# Patient Record
Sex: Female | Born: 1995 | Race: White | Hispanic: No | Marital: Single | Smoking: Former smoker
Health system: Southern US, Community
[De-identification: ages and names within clinical notes are randomized; demographics above are authoritative.]

## PROBLEM LIST (undated history)

## (undated) DIAGNOSIS — F4310 Post-traumatic stress disorder, unspecified: Secondary | ICD-10-CM

## (undated) DIAGNOSIS — J45909 Unspecified asthma, uncomplicated: Secondary | ICD-10-CM

## (undated) DIAGNOSIS — G8929 Other chronic pain: Secondary | ICD-10-CM

## (undated) DIAGNOSIS — F32A Depression, unspecified: Secondary | ICD-10-CM

## (undated) HISTORY — DX: Post-traumatic stress disorder, unspecified: F43.10

## (undated) HISTORY — DX: Depression, unspecified: F32.A

---

## 2020-04-13 HISTORY — PX: IUD REMOVAL: SHX5392

## 2020-12-09 ENCOUNTER — Encounter: Payer: Self-pay | Admitting: Family Medicine

## 2021-01-17 ENCOUNTER — Encounter: Payer: Self-pay | Admitting: Obstetrics and Gynecology

## 2021-01-18 ENCOUNTER — Encounter (HOSPITAL_COMMUNITY): Payer: Self-pay | Admitting: Family Medicine

## 2021-01-18 ENCOUNTER — Other Ambulatory Visit: Payer: Self-pay

## 2021-01-18 ENCOUNTER — Inpatient Hospital Stay (HOSPITAL_COMMUNITY)
Admission: AD | Admit: 2021-01-18 | Discharge: 2021-01-18 | Disposition: A | Discharge status: Home / Self Care | Payer: Medicaid Other | Attending: Family Medicine | Admitting: Family Medicine

## 2021-01-18 DIAGNOSIS — R1031 Right lower quadrant pain: Secondary | ICD-10-CM | POA: Diagnosis not present

## 2021-01-18 DIAGNOSIS — O26893 Other specified pregnancy related conditions, third trimester: Secondary | ICD-10-CM | POA: Insufficient documentation

## 2021-01-18 DIAGNOSIS — Z3A28 28 weeks gestation of pregnancy: Secondary | ICD-10-CM | POA: Diagnosis not present

## 2021-01-18 DIAGNOSIS — K5900 Constipation, unspecified: Secondary | ICD-10-CM

## 2021-01-18 DIAGNOSIS — R109 Unspecified abdominal pain: Secondary | ICD-10-CM

## 2021-01-18 DIAGNOSIS — Z87891 Personal history of nicotine dependence: Secondary | ICD-10-CM | POA: Diagnosis not present

## 2021-01-18 DIAGNOSIS — O99613 Diseases of the digestive system complicating pregnancy, third trimester: Secondary | ICD-10-CM | POA: Insufficient documentation

## 2021-01-18 HISTORY — DX: Unspecified asthma, uncomplicated: J45.909

## 2021-01-18 LAB — CBC WITH DIFFERENTIAL/PLATELET
Abs Immature Granulocytes: 0.1 10*3/uL — ABNORMAL HIGH (ref 0.00–0.07)
Basophils Absolute: 0 10*3/uL (ref 0.0–0.1)
Basophils Relative: 0 %
Eosinophils Absolute: 0.2 10*3/uL (ref 0.0–0.5)
Eosinophils Relative: 2 %
HCT: 37.6 % (ref 36.0–46.0)
Hemoglobin: 12.3 g/dL (ref 12.0–15.0)
Immature Granulocytes: 1 %
Lymphocytes Relative: 25 %
Lymphs Abs: 2.7 10*3/uL (ref 0.7–4.0)
MCH: 31.5 pg (ref 26.0–34.0)
MCHC: 32.7 g/dL (ref 30.0–36.0)
MCV: 96.4 fL (ref 80.0–100.0)
Monocytes Absolute: 0.9 10*3/uL (ref 0.1–1.0)
Monocytes Relative: 8 %
Neutro Abs: 7.1 10*3/uL (ref 1.7–7.7)
Neutrophils Relative %: 64 %
Platelets: 253 10*3/uL (ref 150–400)
RBC: 3.9 MIL/uL (ref 3.87–5.11)
RDW: 12.5 % (ref 11.5–15.5)
WBC: 11 10*3/uL — ABNORMAL HIGH (ref 4.0–10.5)
nRBC: 0 % (ref 0.0–0.2)

## 2021-01-18 LAB — COMPREHENSIVE METABOLIC PANEL
ALT: 14 U/L (ref 0–44)
AST: 17 U/L (ref 15–41)
Albumin: 3 g/dL — ABNORMAL LOW (ref 3.5–5.0)
Alkaline Phosphatase: 89 U/L (ref 38–126)
Anion gap: 9 (ref 5–15)
BUN: 5 mg/dL — ABNORMAL LOW (ref 6–20)
CO2: 21 mmol/L — ABNORMAL LOW (ref 22–32)
Calcium: 8.7 mg/dL — ABNORMAL LOW (ref 8.9–10.3)
Chloride: 104 mmol/L (ref 98–111)
Creatinine, Ser: 0.48 mg/dL (ref 0.44–1.00)
GFR, Estimated: >60 mL/min (ref >60)
Glucose, Bld: 76 mg/dL (ref 70–99)
Potassium: 3.6 mmol/L (ref 3.5–5.1)
Sodium: 134 mmol/L — ABNORMAL LOW (ref 135–145)
Total Bilirubin: 0.9 mg/dL (ref 0.3–1.2)
Total Protein: 6.2 g/dL — ABNORMAL LOW (ref 6.5–8.1)

## 2021-01-18 LAB — URINALYSIS, MICROSCOPIC (REFLEX)

## 2021-01-18 LAB — WET PREP, GENITAL
Clue Cells Wet Prep HPF POC: NONE SEEN
Sperm: NONE SEEN
Trich, Wet Prep: NONE SEEN
WBC, Wet Prep HPF POC: >=10 — AB (ref <10)
Yeast Wet Prep HPF POC: NONE SEEN

## 2021-01-18 LAB — URINALYSIS, ROUTINE W REFLEX MICROSCOPIC
Bilirubin Urine: NEGATIVE
Glucose, UA: NEGATIVE mg/dL
Hgb urine dipstick: NEGATIVE
Ketones, ur: 15 mg/dL — AB
Nitrite: NEGATIVE
Protein, ur: NEGATIVE mg/dL
Specific Gravity, Urine: 1.01 (ref 1.005–1.030)
pH: 6 (ref 5.0–8.0)

## 2021-01-18 NOTE — MAU Note (Signed)
Patient discharged in good condition. Instructions reviewed with patient, f/u appointment 1/5, return for VB, ROM, or contractions. Patient verbalizes understanding.

## 2021-01-18 NOTE — MAU Provider Note (Signed)
History     CSN: [id number]  Arrival date and time: 01/18/21 1226   Event Date/Time   First Provider Initiated Contact with Patient 01/18/21 1449      Chief Complaint  Patient presents with   Abdominal Pain   Abdominal Pain This is a new problem. The current episode started yesterday. The onset quality is sudden. The problem occurs intermittently. The most recent episode lasted 2 hours. The problem has been rapidly improving. The pain is located in the RLQ. The pain is at a severity of 2/10. The abdominal pain does not radiate. Associated symptoms include anorexia, constipation and frequency. Pertinent negatives include no dysuria, fever, headaches (frontal), hematuria, nausea or vomiting. Nothing aggravates the pain. She has tried nothing for the symptoms.  Morgan Rocha is a 25 y.o. G1P0 @[redacted]w[redacted]d  gestation who presents to the MAU with c/o lower abdominal cramping and stabbing pain that started last night about 10 pm and lasted until midnight. Today there has been mild cramping but nothing like last night. She rates her pain today as 2/10. Last night pain was 6/10.  Patient reports she has not started prenatal care because she had a problem with insurance. However, she did have an ultrasound at [facility] in [place] and another one at [facility]. She had an appointment scheduled for this week with [facility] but they called and rescheduled the appointment for January.   OB History     Gravida  1   Para      Term      Preterm      AB      Living         SAB      IAB      Ectopic      Multiple      Live Births              Past Medical History:  Diagnosis Date   Asthma     Past Surgical History:  Procedure Laterality Date   IUD REMOVAL N/A 04/13/2020    History reviewed. No pertinent family history.  Social History   Tobacco Use   Smoking status: Former    Types: Cigarettes, E-cigarettes    Quit date: 04/13/2020    Years since  quitting: 0.7   Smokeless tobacco: Never  Vaping Use   Vaping Use: Former  Substance Use Topics   Alcohol use: Not Currently   Drug use: Yes    Types: Marijuana    Comment: a couple puffs/weel    Allergies: No Known Allergies  No medications prior to admission.    Review of Systems  Constitutional:  Negative for fever.  HENT:  Negative for congestion, ear pain, facial swelling, sinus pain, sore throat and trouble swallowing.   Eyes:  Negative for photophobia, redness, itching and visual disturbance.  Respiratory:  Negative for shortness of breath.   Cardiovascular:  Negative for chest pain, palpitations and leg swelling.  Gastrointestinal:  Positive for abdominal pain, anorexia and constipation. Negative for nausea and vomiting.  Genitourinary:  Positive for frequency, pelvic pain and vaginal discharge. Negative for dysuria, hematuria and vaginal bleeding.  Musculoskeletal:  Positive for back pain.  Skin:  Negative for rash.  Neurological:  Negative for syncope and headaches (frontal). Dizziness: last night with the pain. Psychiatric/Behavioral:  Negative for confusion.        PTSD  Physical Exam   Blood pressure 106/61, pulse 79, temperature 98 F (36.7 C), temperature source  Oral, resp. rate 16, height 5\' 4"  (1.626 m), weight 63 kg, SpO2 97 %.  Physical Exam Vitals and nursing note reviewed.  Constitutional:      Appearance: She is well-developed.  HENT:     Head: Normocephalic.  Cardiovascular:     Rate and Rhythm: Normal rate and regular rhythm.     Heart sounds: No murmur heard. Pulmonary:     Effort: Pulmonary effort is normal.     Breath sounds: Normal breath sounds.  Abdominal:     General: Bowel sounds are normal.     Palpations: Abdomen is soft.     Tenderness: There is no abdominal tenderness (tenderness is mild with palpation). There is no right CVA tenderness or left CVA tenderness.     Comments: Gravid, c/w dates  Genitourinary:    Vagina: Vaginal  discharge present.     Cervix: Discharge and friability present.     Uterus: Not enlarged (c/w dates).      Adnexa:        Right: No tenderness.         Left: No tenderness.    Skin:    General: Skin is warm and dry.  Neurological:     General: No focal deficit present.     Mental Status: She is alert.  Psychiatric:        Mood and Affect: Mood normal.    MAU Course  Procedures  Results for orders placed or performed during the hospital encounter of 01/18/21 (from the past 24 hour(s))  Urinalysis, Routine w reflex microscopic Urine, Clean Catch     Status: Abnormal   Collection Time: 01/18/21  3:20 PM  Result Value Ref Range   Color, Urine YELLOW YELLOW   APPearance CLEAR CLEAR   Specific Gravity, Urine 1.010 1.005 - 1.030   pH 6.0 5.0 - 8.0   Glucose, UA NEGATIVE NEGATIVE mg/dL   Hgb urine dipstick NEGATIVE NEGATIVE   Bilirubin Urine NEGATIVE NEGATIVE   Ketones, ur 15 (A) NEGATIVE mg/dL   Protein, ur NEGATIVE NEGATIVE mg/dL   Nitrite NEGATIVE NEGATIVE   Leukocytes,Ua TRACE (A) NEGATIVE  Urinalysis, Microscopic (reflex)     Status: Abnormal   Collection Time: 01/18/21  3:20 PM  Result Value Ref Range   RBC / HPF 0-5 0 - 5 RBC/hpf   WBC, UA 0-5 0 - 5 WBC/hpf   Bacteria, UA RARE (A) NONE SEEN   Squamous Epithelial / LPF 0-5 0 - 5   Mucus PRESENT   CBC with Differential/Platelet     Status: Abnormal   Collection Time: 01/18/21  3:27 PM  Result Value Ref Range   WBC 11.0 (H) 4.0 - 10.5 K/uL   RBC 3.90 3.87 - 5.11 MIL/uL   Hemoglobin 12.3 12.0 - 15.0 g/dL   HCT 37.6 36.0 - 46.0 %   MCV 96.4 80.0 - 100.0 fL   MCH 31.5 26.0 - 34.0 pg   MCHC 32.7 30.0 - 36.0 g/dL   RDW 12.5 11.5 - 15.5 %   Platelets 253 150 - 400 K/uL   nRBC 0.0 0.0 - 0.2 %   Neutrophils Relative % 64 %   Neutro Abs 7.1 1.7 - 7.7 K/uL   Lymphocytes Relative 25 %   Lymphs Abs 2.7 0.7 - 4.0 K/uL   Monocytes Relative 8 %   Monocytes Absolute 0.9 0.1 - 1.0 K/uL   Eosinophils Relative 2 %   Eosinophils  Absolute 0.2 0.0 - 0.5 K/uL   Basophils Relative 0 %  Basophils Absolute 0.0 0.0 - 0.1 K/uL   Immature Granulocytes 1 %   Abs Immature Granulocytes 0.10 (H) 0.00 - 0.07 K/uL  Comprehensive metabolic panel     Status: Abnormal   Collection Time: 01/18/21  3:27 PM  Result Value Ref Range   Sodium 134 (L) 135 - 145 mmol/L   Potassium 3.6 3.5 - 5.1 mmol/L   Chloride 104 98 - 111 mmol/L   CO2 21 (L) 22 - 32 mmol/L   Glucose, Bld 76 70 - 99 mg/dL   BUN 5 (L) 6 - 20 mg/dL   Creatinine, Ser 0.48 0.44 - 1.00 mg/dL   Calcium 8.7 (L) 8.9 - 10.3 mg/dL   Total Protein 6.2 (L) 6.5 - 8.1 g/dL   Albumin 3.0 (L) 3.5 - 5.0 g/dL   AST 17 15 - 41 U/L   ALT 14 0 - 44 U/L   Alkaline Phosphatase 89 38 - 126 U/L   Total Bilirubin 0.9 0.3 - 1.2 mg/dL   GFR, Estimated >60 >60 mL/min   Anion gap 9 5 - 15  Wet prep, genital     Status: Abnormal   Collection Time: 01/18/21  5:11 PM   Specimen: PATH Cytology Cervicovaginal Ancillary Only; Genital  Result Value Ref Range   Yeast Wet Prep HPF POC NONE SEEN NONE SEEN   Trich, Wet Prep NONE SEEN NONE SEEN   Clue Cells Wet Prep HPF POC NONE SEEN NONE SEEN   WBC, Wet Prep HPF POC >=10 (A) <10   Sperm NONE SEEN     Assessment and Plan  Abdominal pain during pregnancy in third trimester  Constipation, unspecified constipation type  [redacted] weeks gestation of pregnancy EFM: Baseline 145-150, reactive tracing. Tracing reviewed with Aldona Bar, CNM.  Lab results reviewed. Discussed with the patient clinical and lab findings and plan of care. Patient exam not concerning for acute abdomen. Cervix closed. No lower extremity edema. [facility]. Discussed in detail with the patient that if she develops vaginal bleeding, leaking of fluid, persistent vomiting, feeling weak or dizzy to return to MAU immediately. Patient voices understanding and agrees with plan.    [facility] 01/18/2021, 6:25 PM
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
## 2021-01-18 NOTE — Discharge Instructions (Signed)
Be sure to start your prenatal care as scheduled. If your symptoms worsen return here for further evaluation.

## 2021-01-19 LAB — GC/CHLAMYDIA PROBE AMP (~~LOC~~) NOT AT ARMC
Chlamydia: NEGATIVE
Comment: NEGATIVE
Comment: NORMAL
Neisseria Gonorrhea: NEGATIVE

## 2021-02-13 NOTE — L&D Delivery Note (Signed)
OB/GYN Faculty Practice Delivery Note  Morgan Rocha is a 26 y.o. G1P0 s/p SVD at [redacted]w[redacted]d. She was admitted for SOL.   ROM: 0h 53m with clear/bloody fluid GBS Status: Negative/-- (01/26 0902) Maximum Maternal Temperature: 97.5F  Labor Progress: Initial SVE: 9.5/100/0. She then progressed to complete with terminal AROM.   Delivery Date/Time: 2/14 at 0226 Delivery: Called to room after patient arrived quickly from MAU. Patient was complete and involuntarily pushing. During this time, she was having variable decelerations with contractions, however while pushing at around 1+ station, HR sustained <90 bpm for several minutes. Fortunately at 2+ station with pushing and thus obtained verbal consent for vacuum placement. Risks of vacuum assistance were discussed in detail, including but not limited to, bleeding, infection, damage to maternal tissues, inability to achieve vaginal delivery of the infant and need for emergency cesarean section.   Kiwi vacuum placed and used with three pushes, no pop-offs. HR returned >120bpm during rest and removed kiwi for last push to delivery. Head delivered L OA. Nuchal cord present and delivered through. Shoulder and body delivered in usual fashion. Dr. Roselie Awkward present for delivery. Infant with spontaneous cry, but with poor tone, placed on mother's abdomen, dried and stimulated. Cord clamped x 2 shortly after delivery by provider, taken to the warmer for evaluation. Cord blood drawn. Insufficient sample for ABG. Placenta delivered spontaneously with gentle cord traction. Fundus firm with massage, methergine, and Pitocin (initially given IM without IV access, but able to have IV placed later and continued with IV pit). Labia, perineum, vagina, and cervix inspected with bilateral peri-urethral and vaginal canal lacerations. They were repaired with 4.0 Monocryl and 3.0 vicryl respectively.  Baby Weight: pending  Placenta: 3 vessel, intact. Sent to L&D Complications:  None Lacerations: bilateral peri-urethral, vaginal canal with intact perineum  EBL: 400 mL Analgesia: Local lidocaine for repair   Infant:  APGAR (1 MIN): 9   APGAR (5 MINS): [place], DO  OB Family Medicine Fellow, [facility] for [facility] 03/29/2021, 3:59 AM

## 2021-02-17 ENCOUNTER — Ambulatory Visit (INDEPENDENT_AMBULATORY_CARE_PROVIDER_SITE_OTHER): Payer: Medicaid Other | Admitting: Advanced Practice Midwife

## 2021-02-17 ENCOUNTER — Other Ambulatory Visit: Payer: Self-pay

## 2021-02-17 ENCOUNTER — Encounter: Payer: Self-pay | Admitting: Advanced Practice Midwife

## 2021-02-17 VITALS — BP 115/58 | HR 86 | Wt 147.9 lb

## 2021-02-17 DIAGNOSIS — Z3403 Encounter for supervision of normal first pregnancy, third trimester: Secondary | ICD-10-CM | POA: Diagnosis not present

## 2021-02-17 DIAGNOSIS — Z3400 Encounter for supervision of normal first pregnancy, unspecified trimester: Secondary | ICD-10-CM

## 2021-02-17 DIAGNOSIS — G8929 Other chronic pain: Secondary | ICD-10-CM

## 2021-02-17 DIAGNOSIS — M549 Dorsalgia, unspecified: Secondary | ICD-10-CM

## 2021-02-17 DIAGNOSIS — O0933 Supervision of pregnancy with insufficient antenatal care, third trimester: Secondary | ICD-10-CM

## 2021-02-17 DIAGNOSIS — Z23 Encounter for immunization: Secondary | ICD-10-CM | POA: Diagnosis not present
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
## 2021-02-17 NOTE — Progress Notes (Signed)
Patient ultrasound scheduled for 02/26/20 at 10:15 AM. Patient notified.

## 2021-02-17 NOTE — Patient Instructions (Addendum)
AREA PEDIATRIC/FAMILY PRACTICE PHYSICIANS  [place] ([address])  [facility] Chambliss, MD; Eniola, MD; Hale, MD; Hensel, MD; McDiarmid, MD; McIntyer, MD; Neal, MD; Walden, MD [address] [place], [place] [address] [phone] Mon-Fri 8:30-12:30, 1:30-5:00 Providers come to see babies at [facility] Accepting Medicaid [facility] providers who accept newborns: Koirala, MD; Morrow, MD; Wolters, MD [address], [place], [place] [address] [phone] Mon-Fri 8:00-5:30 Babies seen by providers at [facility] Does NOT accept Medicaid Please call early in hospitalization for appointment (limited availability)  [facility] Mulberry, MD [address] [place], [place] [address] [phone] Mon, Tue, Thur, Fri 8:30-5:00, Wed 10:00-7:00 (closed 1-2pm) Babies seen by [facility] providers Accepting Medicaid Rubin - Pediatrician Rubin, MD [address], [place], [place] [address] [phone] Mon-Fri 8:30-5:00, Sat 8:30-12:00 Provider comes to see babies at [facility] Accepting Medicaid Must have been referred from current patients or contacted office prior to delivery [facility] ([facility]) Brown, MD; Chandler, MD; Ettefagh, MD; Grant, MD; Lester, MD; McCormick, MD; McQueen, MD; Prose, MD; Simha, MD; Stanley, MD; Stryffeler, NP; Tebben, NP [address], [place], [place] [address] [phone] Mon, Tue, Thur, Fri 8:30-5:30, Wed 9:30-5:30, Sat 8:30-12:30 Babies seen by [facility] providers Accepting Medicaid Only accepting infants of first-time parents or siblings of current patients Hospital discharge coordinator will make follow-up appointment Jack Amos [address], [place], [place]  [address] [phone]   Fax - [phone] [facility] [address], [place], [place]  [address] Phone - [phone]   Fax - [phone] Shilpa Gosrani [address], [place], [place]  [address] [phone]  [place] ([address]) [facility] Bates, MD; [place]ass[place], MD; Cooper, Cox, MD; MD; Davis, MD; Dovico, MD; Ettefaugh, MD; Little, MD; Lowe, MD; Keiffer, MD; Melvin, MD; Sumner, MD; Williams, MD [address], [place], [place] [address] [phone] Mon-Fri 8:30-5:00 (extended evenings Mon-Thur as needed), Sat-Sun 10:00-1:00 Providers come to see babies at [facility] Accepting Medicaid for families of first-time babies and families with all children in the household age 3 and under. Must register with office prior to making appointment (M-F only). [facility] Henson, NP; Knapp, MD; Lalonde, MD; Tysinger, PA [address] [place], [place] [address] [phone] Mon-Fri 8:00-5:00 Babies seen by providers at [facility] Does NOT accept Medicaid/Commercial Insurance Only [facility] ([facility])  Artis, MD; Barnes, MD; Bratton, MD; Coccaro, MD; Lockett Gardner, MD; Kramer, MD; Marshall, MD; Netherton, MD; Poleto, MD; Skinner, MD [address] [place], [place] [address] [phone] Mon-Fri 8:30-5:30, Sat (Oct.-Mar.) 9:00-1:00 Babies seen by providers at [facility] Accepting Medicaid  [place] ([address]) [facility] Reid, MD; Warner, MD [address], [place], [place] [address] [phone] Mon-Fri 8:30-5:00, Sat 8:30-12:00 Providers come to see babies at [facility] Does NOT accept Medicaid [facility] Becker, PA; Hagler, MD; Scifres, PA; Sun, MD; Swayne, MD [address], [place],  [address] [phone] Mon-Fri 8:00-5:00 Babies seen by providers at [facility] Does NOT accept Medicaid Only accepting babies of parents who  are patients Please call early in hospitalization for appointment (limited availability)  Pediatricians Clark, MD; Frye, MD; Kelleher, MD; Mack, NP; Miller, MD; O'Keller, MD; Patterson, NP; Pudlo, MD; Puzio, MD; Thomas, MD; Tucker, MD; Twiselton, MD [address], [place], [place] [address] [phone] Mon-Fri 8:00-5:00, Sat 9:00-12:00 Providers come to see babies at [facility] Does NOT accept Medicaid  [place] ([address]) [facility] providers accepting new patients: Brake, NP; Wharton, PA [address], [place], [place] [address] [phone] Mon-Fri 8:00-5:00 Babies seen by providers at [facility] Does NOT accept Medicaid Only accepting babies of parents who are patients Please call early in hospitalization for appointment (limited availability) [facility] Gay, MD; Quinlan, MD [address] [place], [place] [address] [phone] (press 1 to schedule appointment) Mon-Fri 8:00-5:00 Providers come to see babies at [facility] Does NOT accept Medicaid [facility] Mazer, MD [address] [place], [place] [address] [phone] Mon-Fri 8:30-5:00 (lunch 12:30-1:00), extended hours by appointment only Wed 5:00-6:30 Babies seen by [facility] providers Accepting Medicaid [facility] Banks, MD; Jordan, MD; Koberlein, MD [address], [place], [place] [address] [phone] Mon-Fri 8:00-5:00 Babies seen by [facility] providers Does NOT accept Medicaid [facility] Parker, MD; Hunter, MD; Wallace, DO [address] [place], [place] [address] [phone] Mon-Fri 8:00-5:00 Babies seen by [facility] providers Does NOT accept Medicaid [facility] Brandon, PA; Brecken, PA; Christy, NP; Dees, MD; DeClaire, MD; DeWeese, MD; Hansen, NP; Mills, NP; Parrish, NP; Smoot, NP; Summer, MD; Vapne,  MD [address] [place], [place] [address] [phone] Mon-Fri 8:30-5:00, Sat 10:00-1:00 Providers come to see babies at [facility] Does NOT accept Medicaid Free prenatal information session Tuesdays at 4:45pm [facility] Bouska, MD; Gordon, PA; Jeffery, PA; Weber, PA [address] [place] [address] [phone] Mon-Fri 7:30-5:30 Babies seen by [facility] providers [facility] [address], [place], [place]  [address] [phone]   Fax - [phone]  [place] ([address] & [address]) [facility] [place] [address] [place], [place] [address] [phone] Mon-Thur 8:00-6:00 Providers come to see babies at [facility] Accepting Medicaid [facility] Anderson, NP; Badger, MD; Beal, PA; Spencer, PA [address] [place], [place] [address] [phone] Mon-Thur 7:30-7:30, Fri 7:30-4:30 Babies seen by [facility] providers Accepting Medicaid [facility] Agbuya, MD; Klett, NP; Romgoolam, MD [address], [place], [place] [address] [phone] Mon-Fri 8:30-5:00, Sat 8:30-12:00 Providers come to see babies at [facility] Accepting Medicaid Must have "Meet & Greet" appointment at office prior to delivery [facility] ([facility]) McCord, MD; Wallace, MD; Wood, MD [address], [place], [place] [address] [phone] Mon-Wed 8:00-6:00, Thur-Fri 8:00-5:00, Sat 9:00-12:00 Providers come to see babies at [facility] Does NOT accept Medicaid Only accepting siblings of current patients [facility]  [address], [place], [place]  [address] [phone]   Fax - [phone] [facility] [address], [place], [place]  [address] [phone]   Fax -  [phone]  [place] ([address] & [address])  [facility] Cirigliano, DO; Matthews, DO [address] [place],  [address] [phone] Mon-Fri 7:00-5:00 Babies seen by [facility] providers Does NOT accept Medicaid [facility] Briscoe, MD; Howley, PA; Moreira, PA [address], [place],  [address] [phone] Mon-Fri 8:00-5:00 Babies seen by [facility] providers Accepting Medicaid [facility] Boyd, MD; Church, PA; Jones, NP; Osborn, PA [address], [place],  [address] [phone] Mon-Fri 8:00-5:00 Babies seen by providers at [facility] Accepting Medicaid  [place] ([address]) [facility] [place] Wendling, DO [address] [place], [place] [address] [phone] Mon-Fri 8:00-5:00 Babies seen by [facility] providers Does NOT accept Medicaid Limited availability, please call early in hospitalization to schedule follow-up [facility] Calderon, PA; Cummings, MD; Dillard, MD; Martin, PA; Olson, MD; VanDeven, PA [address], [place], [place] [address] [phone] Mon-Fri 8:30-5:00, Sat 9:00-12:00 Babies seen by providers at [facility] Accepting Medicaid Please register online then schedule online or call office [email or web address] [facility]) Hunter, NP; Kumar, MD; Martin Rogers, PA [address], [place], [place] [address] [phone] Mon-Fri 8:00-5:00 Babies seen by providers at [facility] Accepting Medicaid [facility] ([facility]) [place], MD; Kristi Fleenor, NP; [place], MD [address], [place], [place] [address] [phone] Mon-Fri 8:00-5:30, Sat&Sun by appointment (phones open at  8:30) Babies seen by [facility] providers Accepting Medicaid Must be a first-time baby or sibling of current patient [facility] [place]  [address], [place], [place]  [address] [phone]   Fax - [phone]  [place] ([address]) [facility] Brown, PA; Cowen, PA; Rice, MD; Helton, PA; Spry, MD [address] [place], [place] [address] [phone] Mon-Thur 8:00-7:00, Fri 8:00-5:00, Sat 8:00-12:00, Sun 9:00-12:00 Babies seen by [facility] providers Accepting Medicaid [facility] [place] Coe-Goins, MD; Marshall, MD; Pierre-Louis, MD [address] [place] [address], [place], [place] [address] [phone] Mon-Thur 8:00-5:00 Babies seen by providers at [facility] Accepting Medicaid [facility] Bratton, MD; Coe-Goins, MD; Hayes, MD; Lewis, MD; List, MD; Lott, MD; Marshall, MD; Moran, MD; O'Neal, MD; Pierre-Louis, MD; Pitonzo, MD; Scholer, MD; Spangle, MD [address] [place], [place] [address] [phone] Mon-Fri 8:00-5:30, Sat (Oct.-Mar.) 9:00-1:00 Babies seen by providers at [facility] Accepting Medicaid Must fill out new patient packet, available online at [email or web address] [facility] ([facility]) Friddle, NP; Harris, NP; Kelly, NP; Logan, MD; Melvin, PA; Poth, MD; Ramadoss, MD; Stanton, NP [address], [place], [place] [address] [phone] Mon-Thur 8:00-5:30, Fri 8:00-5:00 Babies seen by providers at [facility] Accepting Medicaid  [place] ([address]) [facility] Dixon, PA; Aromas, MD; Pickard, MD; Tapia, PA [address], [place], [place] [address] [phone] Mon-Fri 8:00-5:00 Babies seen by providers at [facility] Accepting Medicaid   [place] ([address]) [facility] [place] Masneri, DO; Meyers, MD; Nelson, PA [address], [place], [place] [address] [phone] Mon-Fri 8:00-5:00 Babies seen by providers at [facility] Does NOT accept Medicaid Limited appointment availability, please call early in hospitalization  [facility] [place] Kunedd, DO; McGowen, MD [address], [place], [place] [address] [phone] Mon-Fri 8:00-5:00 Babies seen by [facility] providers Does NOT accept Medicaid [facility] [place] Cameron, MD; MacDonald, MD; Michaels, PA; Nayak, MD [address] [place] [address], [place], [place] [address] [phone] Mon-Fri 8:00-5:00 After hours clinic ([address] [place], [place] [address]) [phone] Mon-Fri 5:00-8:00, Sat 12:00-6:00, Sun 10:00-4:00 Babies seen by [facility] providers Accepting Medicaid [facility] [place] [address] [place], [place]  [address] [phone]   Fax - [phone]  [place] [phone]) Adult nurse [facility], MD [address] [place], [place], [place] [address] [phone] Mon-Fri 8:00-5:00 Babies seen by [facility] providers Does NOT accept Medicaid [facility] [place] [facility] [place]) Rene Kocher, MD [address] [place], [place] [address] [phone] Mon-Thur 8:00-7:00, Fri 8:00-5:00, Sat 8:00-12:00 Babies seen by providers at [facility] Accepting Medicaid - but does not have vaccinations in office (must be received elsewhere) Limited availability, please call early in hospitalization  [place] [phone]) [facility]  Wyvonne Lenz, MD [address], [place] [address] [phone]  Fax [phone]  [facility]  Lyndel Safe, MD, [place], [place], [place], [place] [address] [place], [place]  [address] [phone] [facility]  [address] Sherian Maroon [place], [place] [address] [phone] [address], [place], [place] [address] [phone] [facility] Office)  [facility] [address] [place], [place] [address] [phone] [facility] [address] [place], [place], [place] [address] [phone] [facility] [address], [place], [place] [address] [phone] [facility] [address] [place], [place] [address] [phone] [facility] [address], [place], [place] [address] [phone] [facility] [address], [place], [place] [address] [phone] [facility]  [address] [place], [place] [address] [phone] Dr. Belia Heman. [address], [place], [place] [address] [phone] [facility] [address], [place], [place] [address] [phone] [facility] [address] [place], [place] [address] [phone]   DOULA LIST   [employer] Doula  [place]  [phone]  [email or web address]  [email or web address]  Zula the [employer] Price [phone]  [email or web address]   [employer]   Precious Danford Bad   [email or web address]   ??THE MOTHERLY DOULA?? Zola Button   [phone]   [email or web address] .[email or web address]     The Abundant Life Doula  Olive Bass  [phone]    [email or web address]   [employer]gie[employer] Doula [employer]  Angie Rosier     [phone]     [email or web address]   Renato Gails: Doula & Photographer   Renato Gails [phone]       [email or web address]  [email or web address]   [employer] Doula Services  [place] [phone]   [email or web address] [place], [place]  Lolita Rieger  [phone]  [email or web address]    [email or web address]   [employer] Doula [employer]   Kizzie Furnish   [phone]  [email or web address]   Dina Rich [address] Doula  Dina Rich  [phone] [email or web address]  Natural Baby Doulas  Cornelious Bryant         [facility]       Lora Reynolds     [phone] [email or web address]  [email or web address]   [facility]   [place] [phone] [email or web address]   [facility] Doula Services  Camelia Eng     [phone]  [email or web address]  [facility]     [phone]  [email or web address]  Facebook and IG @[email or web address] .  [phone] [email or web address] .[phone] [email or web address]   [phone]  [phone] [email or web address]     [facility]  [phone] [email or web address]   IG [email or web address]    [phone]    [phone] [email or web address]   Lurline Hare" Free  [phone] [email or web address]    Mtende  Roll  [phone] [email or web address]   Susie Williams   [email or web address]    Denita Lung    [phone] [email or web address]     Lenard Galloway  [phone] [email or web address]    Kizzie Ide  [phone] [email or web address]    [place] Rhem    [phone]   Baby on the Brain Lucky Cowboy  [phone] [email or web address]  Doula Mama Maryjean Ka [phone] [email or web address] [email or web address]  Baby on the Brain Lucky Cowboy  [phone] [email or web address]  [facility] Enzo Montgomery [phone]  [email or web address]  [email or web address] Kell Harris-Jones  [phone] [email or web address] .[email or web address]   Ardine Eng [phone] [email or web address]   Gilda Crease  [phone] [email or web address]   Jonelle Sports  [phone] [email or web address]  [employer]  Jackelyn Poling  [phone] [email or web address]   Cristina Gong [phone] [email or web address]  MOOR Divine Myeka Dunn  [email or web address] .[email or web address]   Glory Buff [phone] [email or web address] .[email or web address] Ny [phone] [email or web address]   Eldridge Abrahams [phone] [email or web address]    Safe Medications in Pregnancy   Acne: Benzoyl Peroxide Salicylic Acid  Backache/Headache: Tylenol: 2 regular strength every 4 hours OR              2 Extra strength every 6 hours  Colds/Coughs/Allergies: Benadryl (alcohol free) 25 mg every 6 hours as needed Breath right strips Claritin Cepacol throat lozenges Chloraseptic throat spray Cold-Eeze- up to three times per day Cough drops, alcohol free Flonase (by prescription only) Guaifenesin Mucinex Robitussin DM (plain only, alcohol free) Saline nasal spray/drops Sudafed (pseudoephedrine) & Actifed ** use only after [redacted] weeks gestation and if you do not have high blood pressure Tylenol Vicks Vaporub Zinc lozenges Zyrtec   Constipation: Colace Ducolax suppositories Fleet enema Glycerin suppositories Metamucil Milk of magnesia Miralax Senokot Smooth move tea  Diarrhea: Kaopectate Imodium A-D  *NO pepto Bismol  Hemorrhoids: Anusol Anusol HC Preparation H Tucks  Indigestion: Tums Maalox Mylanta Zantac  Pepcid  Insomnia: Benadryl (alcohol free) 25mg  every 6 hours as needed Tylenol PM Unisom, no Gelcaps  Leg Cramps: Tums MagGel  Nausea/Vomiting:  Bonine Dramamine Emetrol Ginger extract Sea bands Meclizine  Nausea medication to take during pregnancy:  Unisom (doxylamine succinate 25 mg tablets) Take one tablet daily at bedtime. If symptoms are not adequately controlled, the dose can be increased to a maximum recommended dose of two tablets daily (1/2 tablet in the  morning, 1/2 tablet mid-afternoon and one at bedtime). Vitamin B6 100mg  tablets. Take one tablet twice a day (up to 200 mg per day).  Skin Rashes: Aveeno products Benadryl cream or 25mg  every 6 hours as needed Calamine Lotion 1% cortisone cream  Yeast infection: Gyne-lotrimin 7 Monistat 7   **If taking multiple medications, please check labels to avoid duplicating the same active ingredients **take medication as directed on the label ** Do not exceed 4000 mg of tylenol in 24 hours **Do not take medications that contain aspirin or ibuprofen

## 2021-02-18 ENCOUNTER — Encounter: Payer: Self-pay | Admitting: Advanced Practice Midwife

## 2021-02-18 DIAGNOSIS — O09899 Supervision of other high risk pregnancies, unspecified trimester: Secondary | ICD-10-CM | POA: Insufficient documentation

## 2021-02-18 DIAGNOSIS — Z2839 Other underimmunization status: Secondary | ICD-10-CM | POA: Insufficient documentation

## 2021-02-18 LAB — COMPREHENSIVE METABOLIC PANEL
ALT: 11 IU/L (ref 0–32)
AST: 13 IU/L (ref 0–40)
Albumin/Globulin Ratio: 1.8 (ref 1.2–2.2)
Albumin: 3.6 g/dL — ABNORMAL LOW (ref 3.9–5.0)
Alkaline Phosphatase: 137 IU/L — ABNORMAL HIGH (ref 44–121)
BUN/Creatinine Ratio: 9 (ref 9–23)
BUN: 5 mg/dL — ABNORMAL LOW (ref 6–20)
Bilirubin Total: <0.2 mg/dL (ref 0.0–1.2)
CO2: 17 mmol/L — ABNORMAL LOW (ref 20–29)
Calcium: 9.1 mg/dL (ref 8.7–10.2)
Chloride: 105 mmol/L (ref 96–106)
Creatinine, Ser: 0.54 mg/dL — ABNORMAL LOW (ref 0.57–1.00)
Globulin, Total: 2 g/dL (ref 1.5–4.5)
Glucose: 94 mg/dL (ref 70–99)
Potassium: 4.1 mmol/L (ref 3.5–5.2)
Sodium: 136 mmol/L (ref 134–144)
Total Protein: 5.6 g/dL — ABNORMAL LOW (ref 6.0–8.5)
eGFR: 133 mL/min/{1.73_m2} (ref >59)

## 2021-02-18 LAB — CBC/D/PLT+RPR+RH+ABO+RUBIGG...
Antibody Screen: NEGATIVE
Basophils Absolute: 0 10*3/uL (ref 0.0–0.2)
Basos: 0 %
EOS (ABSOLUTE): 0.2 10*3/uL (ref 0.0–0.4)
Eos: 2 %
HCV Ab: <0.1 s/co ratio (ref 0.0–0.9)
HIV Screen 4th Generation wRfx: NONREACTIVE
Hematocrit: 37 % (ref 34.0–46.6)
Hemoglobin: 12.2 g/dL (ref 11.1–15.9)
Hepatitis B Surface Ag: NEGATIVE
Immature Grans (Abs): 0 10*3/uL (ref 0.0–0.1)
Immature Granulocytes: 0 %
Lymphocytes Absolute: 2.6 10*3/uL (ref 0.7–3.1)
Lymphs: 27 %
MCH: 30.3 pg (ref 26.6–33.0)
MCHC: 33 g/dL (ref 31.5–35.7)
MCV: 92 fL (ref 79–97)
Monocytes Absolute: 0.8 10*3/uL (ref 0.1–0.9)
Monocytes: 8 %
Neutrophils Absolute: 6.1 10*3/uL (ref 1.4–7.0)
Neutrophils: 63 %
Platelets: 266 10*3/uL (ref 150–450)
RBC: 4.02 x10E6/uL (ref 3.77–5.28)
RDW: 11.6 % — ABNORMAL LOW (ref 11.7–15.4)
RPR Ser Ql: NONREACTIVE
Rh Factor: POSITIVE
Rubella Antibodies, IGG: <0.9 index — ABNORMAL LOW (ref >0.99)
WBC: 9.8 10*3/uL (ref 3.4–10.8)

## 2021-02-18 LAB — HEMOGLOBIN A1C
Est. average glucose Bld gHb Est-mCnc: 91 mg/dL
Hgb A1c MFr Bld: 4.8 % (ref 4.8–5.6)

## 2021-02-18 LAB — POCT URINALYSIS DIP (DEVICE)
Bilirubin Urine: NEGATIVE
Glucose, UA: NEGATIVE mg/dL
Hgb urine dipstick: NEGATIVE
Ketones, ur: NEGATIVE mg/dL
Nitrite: NEGATIVE
Protein, ur: NEGATIVE mg/dL
Specific Gravity, Urine: 1.02 (ref 1.005–1.030)
Urobilinogen, UA: 0.2 mg/dL (ref 0.0–1.0)
pH: 6 (ref 5.0–8.0)

## 2021-02-18 LAB — HCV INTERPRETATION

## 2021-02-19 LAB — CULTURE, OB URINE

## 2021-02-19 LAB — URINE CULTURE, OB REFLEX

## 2021-02-25 ENCOUNTER — Ambulatory Visit: Payer: Medicaid Other | Attending: Advanced Practice Midwife

## 2021-02-25 ENCOUNTER — Other Ambulatory Visit: Payer: Self-pay

## 2021-02-25 ENCOUNTER — Encounter: Payer: Self-pay | Admitting: *Deleted

## 2021-02-25 ENCOUNTER — Ambulatory Visit: Payer: Medicaid Other | Admitting: *Deleted

## 2021-02-25 VITALS — BP 122/69 | HR 106

## 2021-02-25 DIAGNOSIS — Z3A34 34 weeks gestation of pregnancy: Secondary | ICD-10-CM | POA: Insufficient documentation

## 2021-02-25 DIAGNOSIS — O0933 Supervision of pregnancy with insufficient antenatal care, third trimester: Secondary | ICD-10-CM | POA: Insufficient documentation

## 2021-02-25 DIAGNOSIS — Z363 Encounter for antenatal screening for malformations: Secondary | ICD-10-CM | POA: Diagnosis not present

## 2021-02-25 DIAGNOSIS — Z3403 Encounter for supervision of normal first pregnancy, third trimester: Secondary | ICD-10-CM

## 2021-03-03 ENCOUNTER — Ambulatory Visit (INDEPENDENT_AMBULATORY_CARE_PROVIDER_SITE_OTHER): Payer: Medicaid Other

## 2021-03-03 ENCOUNTER — Other Ambulatory Visit: Payer: Self-pay

## 2021-03-03 VITALS — BP 120/76 | HR 95 | Wt 151.3 lb

## 2021-03-03 DIAGNOSIS — O0933 Supervision of pregnancy with insufficient antenatal care, third trimester: Secondary | ICD-10-CM | POA: Diagnosis not present

## 2021-03-03 DIAGNOSIS — Z3403 Encounter for supervision of normal first pregnancy, third trimester: Secondary | ICD-10-CM | POA: Diagnosis not present

## 2021-03-03 DIAGNOSIS — Z3A35 35 weeks gestation of pregnancy: Secondary | ICD-10-CM | POA: Diagnosis not present

## 2021-03-03 DIAGNOSIS — Z8659 Personal history of other mental and behavioral disorders: Secondary | ICD-10-CM

## 2021-03-03 NOTE — Progress Notes (Signed)
° °  PRENATAL VISIT NOTE  Subjective:  Morgan Rocha is a 26 y.o. G1P0 at [redacted]w[redacted]d being seen today for ongoing prenatal care.  She is currently monitored for the following issues for this low-risk pregnancy and has Supervision of low-risk first pregnancy and Rubella non-immune status, antepartum on their problem list.  Patient reports increase in braxton hicks contractions, mostly at night.  Contractions: Irritability. Vag. Bleeding: None.  Movement: Present. Denies leaking of fluid.   The following portions of the patient's history were reviewed and updated as appropriate: allergies, current medications, past family history, past medical history, past social history, past surgical history and problem list.   Objective:   Vitals:   03/03/21 1550  BP: 120/76  Pulse: 95  Weight: 151 lb 4.8 oz (68.6 kg)    Fetal Status: Fetal Heart Rate (bpm): 141 Fundal Height: 33 cm Movement: Present     General:  Alert, oriented and cooperative. Patient is in no acute distress.  Skin: Skin is warm and dry. No rash noted.   Cardiovascular: Normal heart rate noted  Respiratory: Normal respiratory effort, no problems with respiration noted  Abdomen: Soft, gravid, appropriate for gestational age.  Pain/Pressure: Present     Pelvic: Cervical exam deferred        Extremities: Normal range of motion.  Edema: None  Mental Status: Normal mood and affect. Normal behavior. Normal judgment and thought content.   Assessment and Plan:  Pregnancy: G1P0 at [redacted]w[redacted]d 1. Encounter for supervision of normal first pregnancy in third trimester - Routine OB. Doing well. - GBS and GC/CT at next visit - Patient lives 2 hours away and would prefer virtual visits from weeks 37-39 if possible.  - Anticipatory guidance for upcoming appointments provided  2. [redacted] weeks gestation of pregnancy   3. Insufficient prenatal care in third trimester - Initial OB visit at 33 weeks  4. History of posttraumatic stress disorder  (PTSD) - Offered IBH referral, declines - Has therapist at home that she regularly meets with   Preterm labor symptoms and general obstetric precautions including but not limited to vaginal bleeding, contractions, leaking of fluid and fetal movement were reviewed in detail with the patient. Please refer to After Visit Summary for other counseling recommendations.   Return in about 1 week (around 03/10/2021).  Future Appointments  Date Time Provider [place]  03/10/2021  8:15 AM Starr [place], CNM [facility]    Renee Harder, CNM

## 2021-03-09 NOTE — Progress Notes (Signed)
° °  PRENATAL VISIT NOTE  Subjective:  Morgan Rocha is a 26 y.o. G1P0 at [redacted]w[redacted]d being seen today for ongoing prenatal care.  She is currently monitored for the following issues for this low-risk pregnancy and has Supervision of low-risk first pregnancy and Rubella non-immune status, antepartum on their problem list.  Patient reports no complaints.  Contractions: Irritability. Vag. Bleeding: None.  Movement: Present. Denies leaking of fluid.   The following portions of the patient's history were reviewed and updated as appropriate: allergies, current medications, past family history, past medical history, past social history, past surgical history and problem list.   Objective:   Vitals:   03/10/21 0821  BP: 118/76  Pulse: 89  Weight: 152 lb (68.9 kg)    Fetal Status: Fetal Heart Rate (bpm): 152 Fundal Height: 34 cm Movement: Present  Presentation: Vertex  General:  Alert, oriented and cooperative. Patient is in no acute distress.  Skin: Skin is warm and dry. No rash noted.   Cardiovascular: Normal heart rate noted  Respiratory: Normal respiratory effort, no problems with respiration noted  Abdomen: Soft, gravid, appropriate for gestational age.  Pain/Pressure: Present     Pelvic:    Dilation: Closed Effacement (%): Thick    Extremities: Normal range of motion.     Mental Status: Normal mood and affect. Normal behavior. Normal judgment and thought content.   Assessment and Plan:  Pregnancy: G1P0 at [redacted]w[redacted]d 1. Encounter for supervision of low-risk first pregnancy in third trimester -patient lives in [place], would like to space out next appts -reviewed warning signs and when to come to MAU -Vertex by vaginal exam and leopolds -will do 1 hour GTT today -chaperone present for exam   Term labor symptoms and general obstetric precautions including but not limited to vaginal bleeding, contractions, leaking of fluid and fetal movement were reviewed in detail with the patient. Please  refer to After Visit Summary for other counseling recommendations.   Return in about 2 weeks (around 03/24/2021), or LROB in two weeks.  Future Appointments  Date Time Provider Department Center  03/16/2021 10:35 AM Currie Paris, NP [facility]  03/24/2021 10:55 AM Hermina Staggers, MD [facility]     Marylene Land, CNM

## 2021-03-10 ENCOUNTER — Ambulatory Visit (INDEPENDENT_AMBULATORY_CARE_PROVIDER_SITE_OTHER): Payer: Medicaid Other | Admitting: Student

## 2021-03-10 ENCOUNTER — Other Ambulatory Visit: Payer: Self-pay

## 2021-03-10 ENCOUNTER — Other Ambulatory Visit (HOSPITAL_COMMUNITY)
Admission: RE | Admit: 2021-03-10 | Discharge: 2021-03-10 | Disposition: A | Discharge status: Home / Self Care | Payer: Medicaid Other | Source: Ambulatory Visit | Attending: Obstetrics & Gynecology | Admitting: Obstetrics & Gynecology

## 2021-03-10 VITALS — BP 118/76 | HR 89 | Wt 152.0 lb

## 2021-03-10 DIAGNOSIS — Z3403 Encounter for supervision of normal first pregnancy, third trimester: Secondary | ICD-10-CM | POA: Insufficient documentation

## 2021-03-10 DIAGNOSIS — Z3A36 36 weeks gestation of pregnancy: Secondary | ICD-10-CM

## 2021-03-11 LAB — CERVICOVAGINAL ANCILLARY ONLY
Chlamydia: NEGATIVE
Comment: NEGATIVE
Comment: NORMAL
Neisseria Gonorrhea: NEGATIVE

## 2021-03-11 LAB — GLUCOSE TOLERANCE, 1 HOUR: Glucose, 1Hr PP: 116 mg/dL (ref 70–199)

## 2021-03-14 ENCOUNTER — Encounter: Payer: Self-pay | Admitting: Obstetrics & Gynecology

## 2021-03-14 LAB — CULTURE, BETA STREP (GROUP B ONLY): Strep Gp B Culture: NEGATIVE

## 2021-03-16 ENCOUNTER — Encounter: Payer: Self-pay | Admitting: Nurse Practitioner

## 2021-03-24 ENCOUNTER — Encounter: Payer: Self-pay | Admitting: Obstetrics and Gynecology

## 2021-03-29 ENCOUNTER — Inpatient Hospital Stay (HOSPITAL_COMMUNITY)
Admission: AD | Admit: 2021-03-29 | Discharge: 2021-03-30 | DRG: 807 | Disposition: A | Discharge status: Home / Self Care | Payer: Medicaid Other | Attending: Obstetrics & Gynecology | Admitting: Obstetrics & Gynecology

## 2021-03-29 ENCOUNTER — Encounter (HOSPITAL_COMMUNITY): Payer: Self-pay | Admitting: Obstetrics & Gynecology

## 2021-03-29 ENCOUNTER — Other Ambulatory Visit: Payer: Self-pay

## 2021-03-29 DIAGNOSIS — Z20822 Contact with and (suspected) exposure to covid-19: Secondary | ICD-10-CM | POA: Diagnosis present

## 2021-03-29 DIAGNOSIS — O26893 Other specified pregnancy related conditions, third trimester: Secondary | ICD-10-CM | POA: Diagnosis present

## 2021-03-29 DIAGNOSIS — Z3A38 38 weeks gestation of pregnancy: Secondary | ICD-10-CM

## 2021-03-29 DIAGNOSIS — Z3403 Encounter for supervision of normal first pregnancy, third trimester: Secondary | ICD-10-CM

## 2021-03-29 DIAGNOSIS — O09899 Supervision of other high risk pregnancies, unspecified trimester: Secondary | ICD-10-CM

## 2021-03-29 DIAGNOSIS — Z87891 Personal history of nicotine dependence: Secondary | ICD-10-CM

## 2021-03-29 DIAGNOSIS — Z3400 Encounter for supervision of normal first pregnancy, unspecified trimester: Secondary | ICD-10-CM

## 2021-03-29 DIAGNOSIS — Z3490 Encounter for supervision of normal pregnancy, unspecified, unspecified trimester: Secondary | ICD-10-CM

## 2021-03-29 HISTORY — DX: Other chronic pain: G89.29

## 2021-03-29 LAB — CBC
HCT: 42.7 % (ref 36.0–46.0)
Hemoglobin: 13.8 g/dL (ref 12.0–15.0)
MCH: 30.3 pg (ref 26.0–34.0)
MCHC: 32.3 g/dL (ref 30.0–36.0)
MCV: 93.8 fL (ref 80.0–100.0)
Platelets: 296 10*3/uL (ref 150–400)
RBC: 4.55 MIL/uL (ref 3.87–5.11)
RDW: 13.2 % (ref 11.5–15.5)
WBC: 15.5 10*3/uL — ABNORMAL HIGH (ref 4.0–10.5)
nRBC: 0 % (ref 0.0–0.2)

## 2021-03-29 LAB — TYPE AND SCREEN
ABO/RH(D): A POS
Antibody Screen: NEGATIVE

## 2021-03-29 LAB — RPR: RPR Ser Ql: NONREACTIVE

## 2021-03-29 LAB — RESP PANEL BY RT-PCR (FLU A&B, COVID) ARPGX2
Influenza A by PCR: NEGATIVE
Influenza B by PCR: NEGATIVE
SARS Coronavirus 2 by RT PCR: NEGATIVE

## 2021-03-29 MED ORDER — DIPHENHYDRAMINE HCL 25 MG PO CAPS: 25.0000 mg | ORAL_CAPSULE | Freq: Four times a day (QID) | ORAL | Status: DC | PRN

## 2021-03-29 MED ORDER — OXYCODONE-ACETAMINOPHEN 5-325 MG PO TABS: 2.0000 | ORAL_TABLET | ORAL | Status: DC | PRN

## 2021-03-29 MED ORDER — METHYLERGONOVINE MALEATE 0.2 MG/ML IJ SOLN
INTRAMUSCULAR | Status: AC
  Filled 2021-03-29: qty 1

## 2021-03-29 MED ORDER — DIBUCAINE (PERIANAL) 1 % EX OINT: 1.0000 "application " | TOPICAL_OINTMENT | CUTANEOUS | Status: DC | PRN

## 2021-03-29 MED ORDER — WITCH HAZEL-GLYCERIN EX PADS: 1.0000 "application " | MEDICATED_PAD | CUTANEOUS | Status: DC | PRN

## 2021-03-29 MED ORDER — BENZOCAINE-MENTHOL 20-0.5 % EX AERO
1.0000 "application " | INHALATION_SPRAY | CUTANEOUS | Status: DC | PRN
  Administered 2021-03-29: 1 via TOPICAL
  Filled 2021-03-29: qty 56

## 2021-03-29 MED ORDER — LIDOCAINE HCL (PF) 1 % IJ SOLN
30.0000 mL | INTRAMUSCULAR | Status: DC | PRN
  Filled 2021-03-29: qty 30

## 2021-03-29 MED ORDER — ONDANSETRON HCL 4 MG/2ML IJ SOLN: 4.0000 mg | Freq: Four times a day (QID) | INTRAMUSCULAR | Status: DC | PRN

## 2021-03-29 MED ORDER — ONDANSETRON HCL 4 MG/2ML IJ SOLN: 4.0000 mg | INTRAMUSCULAR | Status: DC | PRN

## 2021-03-29 MED ORDER — SENNOSIDES-DOCUSATE SODIUM 8.6-50 MG PO TABS
2.0000 | ORAL_TABLET | ORAL | Status: DC
  Administered 2021-03-29 – 2021-03-30 (×2): 2 via ORAL
  Filled 2021-03-29 (×2): qty 2

## 2021-03-29 MED ORDER — OXYTOCIN 10 UNIT/ML IJ SOLN
10.0000 [IU] | Freq: Once | INTRAMUSCULAR | Status: AC
  Administered 2021-03-29: 10 [IU] via INTRAMUSCULAR
  Filled 2021-03-29: qty 1

## 2021-03-29 MED ORDER — SODIUM CHLORIDE 0.9% FLUSH: 3.0000 mL | INTRAVENOUS | Status: DC | PRN

## 2021-03-29 MED ORDER — ACETAMINOPHEN 325 MG PO TABS: 650.0000 mg | ORAL_TABLET | ORAL | Status: DC | PRN

## 2021-03-29 MED ORDER — SOD CITRATE-CITRIC ACID 500-334 MG/5ML PO SOLN: 30.0000 mL | ORAL | Status: DC | PRN

## 2021-03-29 MED ORDER — IBUPROFEN 600 MG PO TABS
600.0000 mg | ORAL_TABLET | Freq: Four times a day (QID) | ORAL | Status: DC
  Administered 2021-03-29 – 2021-03-30 (×6): 600 mg via ORAL
  Filled 2021-03-29 (×6): qty 1

## 2021-03-29 MED ORDER — LIDOCAINE HCL (PF) 1 % IJ SOLN
30.0000 mL | INTRAMUSCULAR | Status: DC | PRN
  Administered 2021-03-29: 30 mL via SUBCUTANEOUS
  Filled 2021-03-29: qty 30

## 2021-03-29 MED ORDER — SODIUM CHLORIDE 0.9% FLUSH: 3.0000 mL | Freq: Two times a day (BID) | INTRAVENOUS | Status: DC

## 2021-03-29 MED ORDER — OXYTOCIN-SODIUM CHLORIDE 30-0.9 UT/500ML-% IV SOLN
2.5000 [IU]/h | INTRAVENOUS | Status: DC
  Filled 2021-03-29: qty 500

## 2021-03-29 MED ORDER — SIMETHICONE 80 MG PO CHEW: 80.0000 mg | CHEWABLE_TABLET | ORAL | Status: DC | PRN

## 2021-03-29 MED ORDER — PRENATAL MULTIVITAMIN CH
1.0000 | ORAL_TABLET | Freq: Every day | ORAL | Status: DC
  Administered 2021-03-29 – 2021-03-30 (×2): 1 via ORAL
  Filled 2021-03-29 (×2): qty 1

## 2021-03-29 MED ORDER — LACTATED RINGERS IV SOLN: 500.0000 mL | INTRAVENOUS | Status: DC | PRN

## 2021-03-29 MED ORDER — COCONUT OIL OIL: 1.0000 "application " | TOPICAL_OIL | Status: DC | PRN

## 2021-03-29 MED ORDER — LIDOCAINE HCL (PF) 1 % IJ SOLN
INTRAMUSCULAR | Status: AC
  Administered 2021-03-29: 30 mL via SUBCUTANEOUS
  Filled 2021-03-29: qty 30

## 2021-03-29 MED ORDER — MEASLES, MUMPS & RUBELLA VAC IJ SOLR: 0.5000 mL | Freq: Once | INTRAMUSCULAR | Status: DC

## 2021-03-29 MED ORDER — OXYTOCIN BOLUS FROM INFUSION
333.0000 mL | Freq: Once | INTRAVENOUS | Status: AC
  Administered 2021-03-29: 333 mL via INTRAVENOUS

## 2021-03-29 MED ORDER — METHYLERGONOVINE MALEATE 0.2 MG/ML IJ SOLN
0.2000 mg | Freq: Once | INTRAMUSCULAR | Status: AC
  Administered 2021-03-29: 0.2 mg via INTRAMUSCULAR

## 2021-03-29 MED ORDER — OXYCODONE-ACETAMINOPHEN 5-325 MG PO TABS: 1.0000 | ORAL_TABLET | ORAL | Status: DC | PRN

## 2021-03-29 MED ORDER — SODIUM CHLORIDE 0.9 % IV SOLN: 250.0000 mL | INTRAVENOUS | Status: DC | PRN

## 2021-03-29 MED ORDER — ONDANSETRON HCL 4 MG PO TABS: 4.0000 mg | ORAL_TABLET | ORAL | Status: DC | PRN

## 2021-03-29 MED ORDER — TETANUS-DIPHTH-ACELL PERTUSSIS 5-2.5-18.5 LF-MCG/0.5 IM SUSY: 0.5000 mL | PREFILLED_SYRINGE | Freq: Once | INTRAMUSCULAR | Status: DC

## 2021-03-29 MED ORDER — ZOLPIDEM TARTRATE 5 MG PO TABS: 5.0000 mg | ORAL_TABLET | Freq: Every evening | ORAL | Status: DC | PRN

## 2021-03-29 MED ORDER — LACTATED RINGERS IV SOLN: INTRAVENOUS | Status: DC

## 2021-03-29 NOTE — H&P (Signed)
OBSTETRIC ADMISSION HISTORY AND PHYSICAL  Morgan Rocha is a 26 y.o. female G1P0 with IUP at 11w6dby LMP presenting for SOL. Arrived to the MAU at 9.5cm. She reports +FMs, No LOF, no VB, no blurry vision, headaches or peripheral edema, and RUQ pain.  She plans on breast feeding. She request nuvaring for birth control. [facility]   Dating: By LMP  --->  Estimated Date of Delivery: 04/06/21  Prenatal History/Complications:  --Rubella non-immune   Past Medical History: Past Medical History:  Diagnosis Date   Asthma    Chronic back pain    Depression    PTSD (post-traumatic stress disorder)     Past Surgical History: Past Surgical History:  Procedure Laterality Date   IUD REMOVAL N/A 04/13/2020    Obstetrical History: OB History     Gravida  1   Para      Term      Preterm      AB      Living         SAB      IAB      Ectopic      Multiple      Live Births              Social History Social History   Socioeconomic History   Marital status: Single    Spouse name: Not on file   Number of children: Not on file   Years of education: Not on file   Highest education level: Not on file  Occupational History   Not on file  Tobacco Use   Smoking status: Former    Types: Cigarettes, E-cigarettes    Quit date: 04/13/2020    Years since quitting: 0.9   Smokeless tobacco: Never  Vaping Use   Vaping Use: Former   Substances: Nicotine-salt  Substance and Sexual Activity   Alcohol use: Not Currently   Drug use: Yes    Frequency: 1.0 times per week    Types: Marijuana    Comment: a couple puffs/weel   Sexual activity: Yes    Comment: previously had IUD  Other Topics Concern   Not on file  Social History Narrative   Not on file   Social Determinants of Health   Financial Resource Strain: Not on file  Food Insecurity: No Food Insecurity   Worried About Morgan Rocha the  Last Year: Never true   RWisnerin the Last Year: Never true  Transportation Needs: No Transportation Needs   Lack of Transportation (Medical): No   Lack of Transportation (Non-Medical): No  Physical Activity: Not on file  Stress: Not on file  Social Connections: Not on file    Family History: Family History  Problem Relation Age of Onset   Hypertension Father    Post-traumatic stress disorder Father    Heart attack Father     Allergies: No Known Allergies  Medications Prior to Admission  Medication Sig Dispense Refill Last Dose   cholecalciferol (VITAMIN D3) 25 MCG (1000 UNIT) tablet Take 1,000 Units by mouth daily.      ferrous sulfate 325 (65 FE) MG tablet Take 325 mg by mouth daily with breakfast.      Prenatal Vit-Fe Fumarate-FA (MULTIVITAMIN-PRENATAL) 27-0.8 MG TABS tablet Take 1 tablet by mouth daily at 12 noon.      vitamin B-12 (CYANOCOBALAMIN) 500 MCG tablet Take 500 mcg by  mouth daily.        Review of Systems   All systems reviewed and negative except as stated in HPI  Blood pressure 125/73, pulse 76, temperature (!) 97.4 F (36.3 C), resp. rate (!) 22, height _0  (1.6 m), SpO2 99 %. General appearance: alert, cooperative, and moderate distress Lungs: Normal WOB  Heart: regular rate  Abdomen: soft, non-tender Pelvic: NEFG Extremities: Homans sign is negative, no sign of DVT Presentation: cephalic Fetal monitoringBaseline: 140 bpm, Variability: Good {> 6 bpm), Accelerations: Reactive, and Decelerations: Absent Uterine activity every 2 min  Dilation: Lip/rim Effacement (%): 100 Exam by:: Dr. Higinio Plan   Prenatal labs: ABO, Rh: --/--/A POS (02/14 0210) Antibody: NEG (02/14 0210) Rubella: <0.90 (01/05 1035) RPR: Non Reactive (01/05 1035)  HBsAg: Negative (01/05 1035)  HIV: Non Reactive (01/05 1035)  GBS: Negative/-- (01/26 0902)  1 hr Glucola passed Genetic screening  LR NIPS Anatomy US normal   Prenatal Transfer Tool  Maternal Diabetes:  No Genetic Screening: Normal Maternal Ultrasounds/Referrals: Normal Fetal Ultrasounds or other Referrals:  None Maternal Substance Abuse:  No Significant Maternal Medications:  None Significant Maternal Lab Results: Group B Strep negative  Results for orders placed or performed during the hospital encounter of 03/29/21 (from the past 24 hour(s))  Type and screen Morgan Rocha   Collection Time: 03/29/21  2:10 AM  Result Value Ref Range   ABO/RH(D) A POS    Antibody Screen NEG    Sample Expiration      04/01/2021,2359 Performed at [facility], [place] [address], [place], [place] [address]   Resp Panel by RT-PCR (Flu A&B, Covid) Nasopharyngeal Swab   Collection Time: 03/29/21  2:11 AM   Specimen: Nasopharyngeal Swab; Nasopharyngeal(NP) swabs in vial transport medium  Result Value Ref Range   SARS Coronavirus 2 by RT PCR NEGATIVE NEGATIVE   Influenza A by PCR NEGATIVE NEGATIVE   Influenza B by PCR NEGATIVE NEGATIVE  CBC   Collection Time: 03/29/21  2:11 AM  Result Value Ref Range   WBC 15.5 (H) 4.0 - 10.5 K/uL   RBC 4.55 3.87 - 5.11 MIL/uL   Hemoglobin 13.8 12.0 - 15.0 g/dL   HCT 42.7 36.0 - 46.0 %   MCV 93.8 80.0 - 100.0 fL   MCH 30.3 26.0 - 34.0 pg   MCHC 32.3 30.0 - 36.0 g/dL   RDW 13.2 11.5 - 15.5 %   Platelets 296 150 - 400 K/uL   nRBC 0.0 0.0 - 0.2 %    Patient Active Problem List   Diagnosis Date Noted   Pregnant 03/29/2021   Rubella non-immune status, antepartum 02/18/2021   Supervision of low-risk first pregnancy 02/17/2021    Assessment/Plan:  Morgan Rocha is a 26 y.o. G1P0 at 47w6dhere for SOL. Found to be 9.5cm in the MAU and delivered on L&D shortly after. See delivery note.   #Labor: Expectantly managed, delivered shortly after arrival.  #Pain: None  #FWB: Cat I>II  #ID: GBS Negative  #MOF: Breastfeeding  #MOC: Nuvaring, will discuss interaction with breastfeeding postpartum  #Circ: NA  #Rubella non-immune: MMR pp.    SPatriciaann Clan DO  03/29/2021, 4:12 AM

## 2021-03-29 NOTE — Lactation Note (Addendum)
This note was copied from a baby's chart. Lactation Consultation Note  Patient Name: Morgan Rocha [id number] Date: 03/29/2021 Reason for consult: Initial assessment;1st time breastfeeding;Primapara;Early term 37-38.6wks;Infant < 6lbs Age:24 hours  LC in to visit with P1 Mom of [redacted]w[redacted]d infant.  Baby weighed 5 lbs 14.2 oz at birth and has had 1 void and 1 stool already.   Baby fed after delivery and is now latched again.  Mom had her in cradle hold swaddled in blankets.  Baby's latch is good and baby noted to be sucking with vigor, with deep jaw extensions and signs of milk transfer noted.  After baby came off, nipple was rounded.    LC unwrapped baby to provide for STS, explaining benefit to baby.  Reviewed hand expression, colostrum easily expressed.  Reviewed spoon feeding.  Assisted Mom in using cross cradle hold and waiting for baby to open wide.  Baby opened her mouth and latched deeply to breast.    Basics reviewed. Encouraged STS and offering the breast often with cues.  Mom to try to awaken baby for feeding after 3 hrs due to baby's weight.  Talked about the need to double pump if baby becomes to sleepy to latch well and feed at least every 3 hrs.  Will continue to monitor I and O.  Mom encouraged to ask for help prn.  Maternal Data Has patient been taught Hand Expression?: Yes Does the patient have breastfeeding experience prior to this delivery?: No  Feeding Mother's Current Feeding Choice: Breast Milk  LATCH Score Latch: Grasps breast easily, tongue down, lips flanged, rhythmical sucking.  Audible Swallowing: Spontaneous and intermittent  Type of Nipple: Everted at rest and after stimulation  Comfort (Breast/Nipple): Soft / non-tender  Hold (Positioning): Assistance needed to correctly position infant at breast and maintain latch.  LATCH Score: 9   Interventions Interventions: Breast feeding basics reviewed;Assisted with latch;Skin to skin;Breast massage;Hand  express;Breast compression;Adjust position;Support pillows;Position options;LC Services brochure  Consult Status Consult Status: Follow-up Date: 03/30/21 Follow-up type: In-patient    Judee Clara 03/29/2021, 8:36 AM

## 2021-03-29 NOTE — Lactation Note (Signed)
This note was copied from a baby's chart. Lactation Consultation Note Mom holding baby STS. LC assisted baby to breast and baby latched great. Baby suckling w/occasionaly swallows noted. Mom has great everted nipples. Mom stated she has been leaking colostrum. Praised mom. Baby is on cool side so blankets covered baby and mom doing STS. Mom will be f/u on MBU.  Patient Name: Girl Kodi Guerrera [id number] Date: 03/29/2021 Reason for consult: L&D Initial assessment;Primapara;Early term 37-38.6wks Age:86 hours  Maternal Data    Feeding    LATCH Score Latch: Grasps breast easily, tongue down, lips flanged, rhythmical sucking.  Audible Swallowing: A few with stimulation  Type of Nipple: Everted at rest and after stimulation  Comfort (Breast/Nipple): Soft / non-tender  Hold (Positioning): Assistance needed to correctly position infant at breast and maintain latch.  LATCH Score: 8   Lactation Tools Discussed/Used    Interventions    Discharge    Consult Status Consult Status: Follow-up Date: 03/29/21 Follow-up type: In-patient    Azazel Franze, Diamond Nickel 03/29/2021, 4:30 AM

## 2021-03-29 NOTE — Discharge Summary (Signed)
Postpartum Discharge Summary  Patient Name: Morgan Rocha DOB: 09-Nov-1995 MRN: [id number]  Date of admission: 03/29/2021 Delivery date:03/29/2021  Delivering provider: [name]  Date of discharge: 03/30/2021  Admitting diagnosis: Pregnant [Z34.90] Intrauterine pregnancy: [redacted]w[redacted]d    Secondary diagnosis:  Principal Problem:   Pregnant Active Problems:   Supervision of low-risk first pregnancy   Rubella non-immune status, antepartum   Vacuum-assisted vaginal delivery  Additional problems: None    Discharge diagnosis: Term Pregnancy Delivered                                              Post partum procedures: None Augmentation: AROM Complications: None  Hospital course: Onset of Labor With Vaginal Delivery      26y.o. yo G1P1001 at 371w6das admitted in Active Labor on 03/29/2021. Patient had an uncomplicated labor course as follows:  Membrane Rupture Time/Date: 2:21 AM ,03/29/2021   Delivery Method:Vaginal, Vacuum (Extractor)  Episiotomy: None  Lacerations:  Periurethral  Patient had an uncomplicated postpartum course.  She is ambulating, tolerating a regular diet, passing flatus, and urinating well. Patient is discharged home in stable condition on 03/30/21.  Newborn Data: Birth date:03/29/2021  Birth time:2:26 AM  Gender:Female  Living status:Living  Apgars:9 ,9  Weight:2670 g   Magnesium Sulfate received: No BMZ received: No Rhophylac:N/A MMMGN:OIBBCWUnd ordered post partum T-DaP:Given prenatally Flu: Given prenatally Transfusion:No  Physical exam  Vitals:   03/29/21 1425 03/29/21 1755 03/29/21 2051 03/30/21 0605  BP: 131/79 110/81 117/73 122/86  Pulse: 86 85 87 89  Resp: 18 16 16 18   Temp: 98.5 F (36.9 C) 98.4 F (36.9 C) 97.6 F (36.4 C) 98.1 F (36.7 C)  TempSrc: Oral Oral Oral Oral  SpO2: 99% 99%    Height:       General: alert Lochia: appropriate Uterine Fundus: firm Incision: N/A DVT Evaluation: No evidence of DVT seen on physical  exam. Labs: Lab Results  Component Value Date   WBC 15.5 (H) 03/29/2021   HGB 13.8 03/29/2021   HCT 42.7 03/29/2021   MCV 93.8 03/29/2021   PLT 296 03/29/2021   CMP Latest Ref Rng & Units 02/17/2021  Glucose 70 - 99 mg/dL 94  BUN 6 - 20 mg/dL 5(L)  Creatinine 0.57 - 1.00 mg/dL 0.54(L)  Sodium 134 - 144 mmol/L 136  Potassium 3.5 - 5.2 mmol/L 4.1  Chloride 96 - 106 mmol/L 105  CO2 20 - 29 mmol/L 17(L)  Calcium 8.7 - 10.2 mg/dL 9.1  Total Protein 6.0 - 8.5 g/dL 5.6(L)  Total Bilirubin 0.0 - 1.2 mg/dL <0.2  Alkaline Phos 44 - 121 IU/L 137(H)  AST 0 - 40 IU/L 13  ALT 0 - 32 IU/L 11   Edinburgh Score: Edinburgh Postnatal Depression Scale Screening Tool 03/30/2021  I have been able to laugh and see the funny side of things. 0  I have looked forward with enjoyment to things. 0  I have blamed myself unnecessarily when things went wrong. 0  I have been anxious or worried for no good reason. 0  I have felt scared or panicky for no good reason. 0  Things have been getting on top of me. 0  I have been so unhappy that I have had difficulty sleeping. 0  I have felt sad or miserable. 0  I have been so unhappy that I  have been crying. 0  The thought of harming myself has occurred to me. 0  Edinburgh Postnatal Depression Scale Total 0     After visit meds:  Allergies as of 03/30/2021   No Known Allergies      Medication List     STOP taking these medications    vitamin B-12 500 MCG tablet Commonly known as: CYANOCOBALAMIN       TAKE these medications    acetaminophen 325 MG tablet Commonly known as: Tylenol Take 2 tablets (650 mg total) by mouth every 4 (four) hours as needed (for pain scale < 4).   cholecalciferol 25 MCG (1000 UNIT) tablet Commonly known as: VITAMIN D3 Take 1,000 Units by mouth daily.   ferrous sulfate 325 (65 FE) MG tablet Take 325 mg by mouth daily with breakfast.   ibuprofen 600 MG tablet Commonly known as: ADVIL Take 1 tablet (600 mg total) by  mouth every 6 (six) hours.   multivitamin-prenatal 27-0.8 MG Tabs tablet Take 1 tablet by mouth daily at 12 noon.         Discharge home in stable condition Infant Feeding: Breast Infant Disposition:home with mother Discharge instruction: per After Visit Summary and Postpartum booklet. Activity: Advance as tolerated. Pelvic rest for 6 weeks.  Diet: routine diet Future Appointments:No future appointments. Follow up Visit: Message sent to [facility] by Dr. Higinio Plan on 2/15  Please schedule this patient for a In person postpartum visit in 6 weeks with the following provider: Any provider. Additional Postpartum F/U:BP check 1 week  Low risk pregnancy complicated by:  None Delivery mode:  Vaginal, Vacuum Neurosurgeon)  Anticipated Birth Control:   plans Nuvaring at 6 weeks   Renard Matter, MD, MPH OB Fellow, Faculty Practice

## 2021-03-29 NOTE — MAU Note (Signed)
Morgan Rocha is a 26 y.o. at [redacted]w[redacted]d here in MAU reporting: Pt having ctx starting 4 hours ago. Leaking fluid. Ctx 3 min apart.  LMP:  Onset of complaint: 2100 Pain score: 10/10 Vitals:   03/29/21 0139  BP: 135/82  Pulse: (!) 110  Resp: (!) 22  Temp: (!) 97.4 F (36.3 C)  SpO2: 99%     FHT: Lab orders placed from triage:

## 2021-03-29 NOTE — Lactation Note (Signed)
This note was copied from a baby's chart. Lactation Consultation Note Attempted to see mom, mom still being repaired.  Patient Name: Morgan Rocha [id number] Date: 03/29/2021   Age:24 hours  Maternal Data    Feeding    LATCH Score                    Lactation Tools Discussed/Used    Interventions    Discharge    Consult Status      Charyl Dancer 03/29/2021, 3:20 AM

## 2021-03-30 MED ORDER — ACETAMINOPHEN 325 MG PO TABS: 650.0000 mg | ORAL_TABLET | ORAL | 0 refills | Status: AC | PRN

## 2021-03-30 MED ORDER — IBUPROFEN 600 MG PO TABS: 600.0000 mg | ORAL_TABLET | Freq: Four times a day (QID) | ORAL | 0 refills | Status: AC

## 2021-03-30 NOTE — Clinical Social Work Maternal (Signed)
CLINICAL SOCIAL WORK MATERNAL/CHILD NOTE  Patient Details  Name: Girl Marquerite Forsman MRN: [id number] Date of Birth: 03/29/2019  Date:  03/30/2021  Clinical Social Worker Initiating Note:  Darra Lis, [place] Date/Time: Initiated:  03/30/21/0945     Child's Name:  Charlynn Court   Biological Parents:  Mother, Father Ruffin Pyo 28)   Need for Interpreter:  None   Reason for Referral:  Late or No Prenatal Care  , Behavioral Health Concerns   Address:  [place]  [address]    Phone number:  [phone] (home)     Additional phone number:   Household Members/Support Persons (HM/SP):   Household Member/Support Person 1   HM/SP Name Relationship DOB or Age  HM/SP -Pioneer Other 28  HM/SP -2        HM/SP -3        HM/SP -4        HM/SP -5        HM/SP -6        HM/SP -7        HM/SP -8          Natural Supports (not living in the home):  Friends   Professional Supports: None   Employment: Unemployed   Type of Work:     Education:  [place] arranged:    Museum/gallery curator Resources:  [employer]   Other Resources:  Physicist, medical   (Provided informaiton on [employer].)   Cultural/Religious Considerations Which May Impact Care:    Strengths:  Ability to meet basic needs  , Home prepared for child  , Pediatrician chosen   Psychotropic Medications:         Pediatrician:     ([place], [place])  Pediatrician List:   [facility]      Pediatrician Fax Number:    Risk Factors/Current Problems:  Substance Use     Cognitive State:  Alert  , Insightful  , Linear Thinking     Mood/Affect:  Flat  , Interested     CSW Assessment: CSW consulted for [facility] at #[id number], THC use and history of PTSD. CSW met with MOB to assess and provide support. CSW introduced self and role. CSW observed infant sleeping in bassinet. CSW  informed MOB of the reason for consult. MOB had a flat affect but was polite. MOB reported she received late prenatal care due to understaffing in the city she resides. MOB stated all of the doctors quit and it took awhile to get an appointment scheduled due to insurance challenges. MOB denies any additional barriers. CSW informed MOB of the hospital drug screen policy. MOB aware a CPS report will be made if infant test positive for substances.  MOB disclosed she smoked THC during the pregnancy to help cope with PTSD. MOB stated she last smoked about a week ago and denies additional substance use.   CSW inquired on MOB mental health history. MOB reported being diagnosed with PTSD, anxiety and depression at 26 years old. MOB stated she was prescribed medication in the past (unable to recall the name), which she did not find helpful. MOB shared she has been to therapy in the past and she is open to it if needs arise. MOB initially denied any mental health concerns during pregnancy, however when asked about previous hospitalization MOB  reported she was hospitalized at [facility] during the first trimester for almost a week. MOB stated she was incorrectly informed she had a miscarriage, which was traumatic and  caused depressive symptoms. MOB confirmed she was also hospitalized in [place] about 2 years ago for depression. MOB reported she voluntarily hospitalized herself on both occassions. CSW asked MOB how she copes with depressive symptoms. MOB stated she practices breathing exercises and writing. MOB was receptive to resources but declined any referrals. MOB identified FOB and friends as supports. MOB denies any SI, HI or DV. MOB reported she is currently doing good and bonding well with infant. MOB did not display any acute mental health symptoms.   CSW provided education regarding the baby blues period versus PPD. CSW provided the New Mom Checklist and encouraged MOB to self evaluate. MOB is comfortable notifying a  professional if needs arise.  CSW provided review of Sudden Infant Death Syndrome (SIDS) precautions. MOB has a bassinet, car seat and all infant essentials. MOB reports no additional stressors or needs at this time.  CSW will continue to follow UDS/CDS and make a CPS report if warranted. CSW identifies no further need for intervention and no barriers to discharge at this time.  CSW Plan/Description:  No Further Intervention Required/No Barriers to Discharge, CSW Will Continue to Monitor Umbilical Cord Tissue Drug Screen Results and Make Report if Warranted, Child Protective Service Report  , [place], Perinatal Mood and Anxiety Disorder (PMADs) Education, Sudden Infant Death Syndrome (SIDS) Education, Other Information/Referral to [employer], Other Patient/Family Education    Waylan Boga, [place] 03/30/2021, 10:45 AM

## 2021-03-30 NOTE — Lactation Note (Signed)
This note was copied from a baby's chart. Lactation Consultation Note  Patient Name: Morgan Rocha [id number] Date: 03/30/2021   Age:24 hours  Mom has no questions or concerns & says breastfeeding "has been great." On exam, her nipples are atraumatic and her breasts palpate as filling. Mom reports that she has been leaking since the 1st trimester and that with hand expression she is able to spray some distance. Mom says she has noticed that her breasts are heavier today and she has noticed softening of the breast with feedings.   Mom has 4 pumps at home, including Spectra and Medela. On exam, Mom appears to be a size 24 flange. Her R breast could possibly use a size 21 flange, but I told Mom to only get that if too much areola is being pulled in with the size 24. I do not think Mom needs to pump at this time, however. Engorgement care/precautions were briefly discussed.  I have no concerns; an update was given to Dr. Margo Aye.   Lurline Hare [facility] 03/30/2021, 11:02 AM

## 2021-04-06 ENCOUNTER — Ambulatory Visit: Payer: Self-pay

## 2021-04-08 ENCOUNTER — Telehealth (HOSPITAL_COMMUNITY): Payer: Self-pay | Admitting: *Deleted

## 2021-04-08 NOTE — Telephone Encounter (Signed)
Phone voicemail message left to return nurse call.  Duffy Rhody, RN 04-08-2021 at 10:38am

## 2021-05-11 ENCOUNTER — Ambulatory Visit: Payer: Self-pay | Admitting: Medical

## 2022-02-08 IMAGING — US US MFM OB DETAIL+14 WK
1 series · 13 of 28 positions shown · non-contrast
Comparison: none

[Series 1: us mfm ob detail+14 wk · 88 acquisitions, 13 frames shown]
[im 4/88]
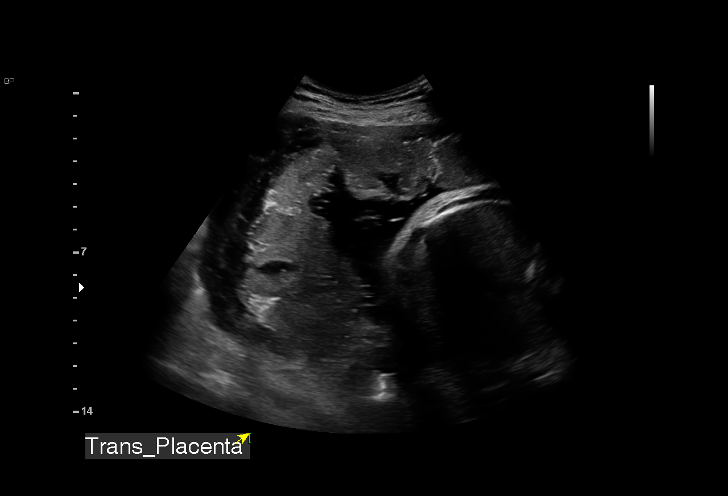
[im 10/88]
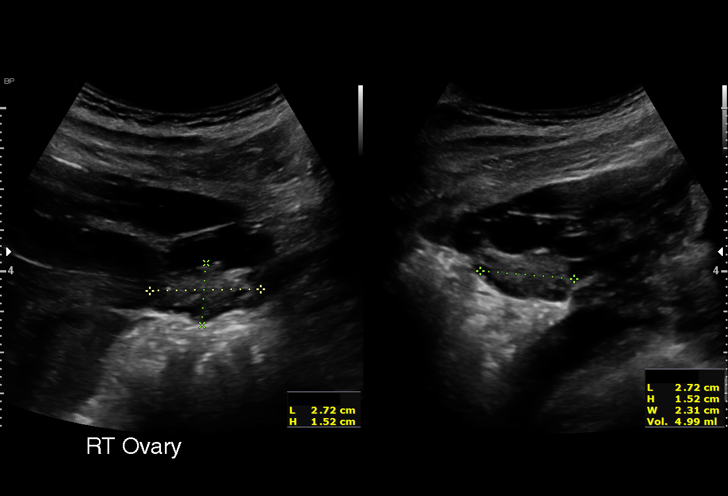
[im 17/88]
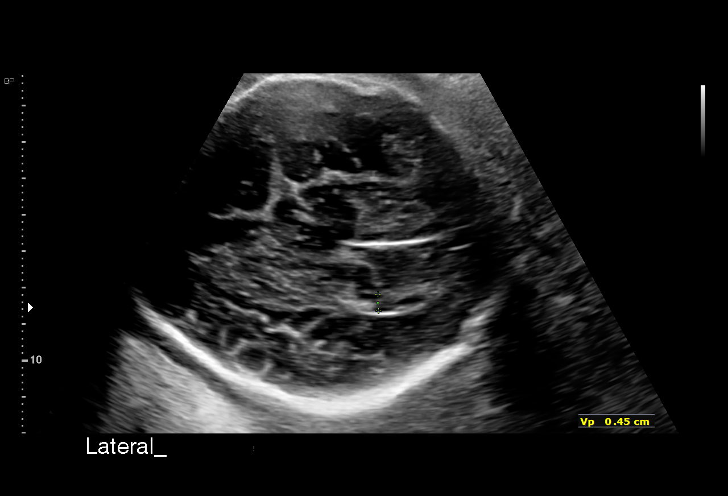
[im 23/88]
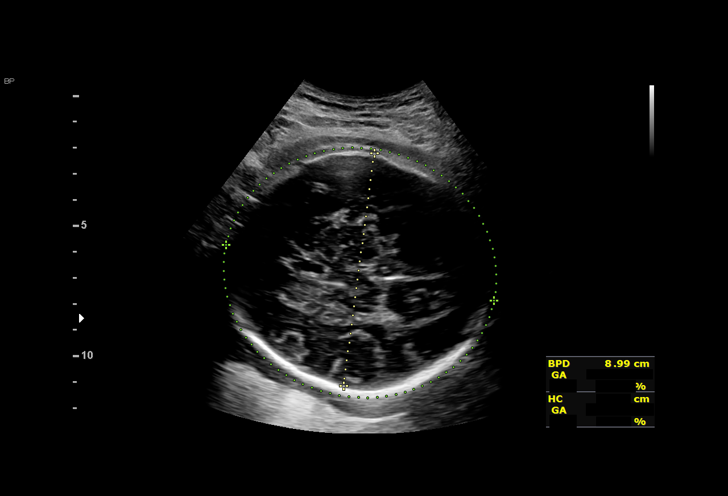
[im 30/88]
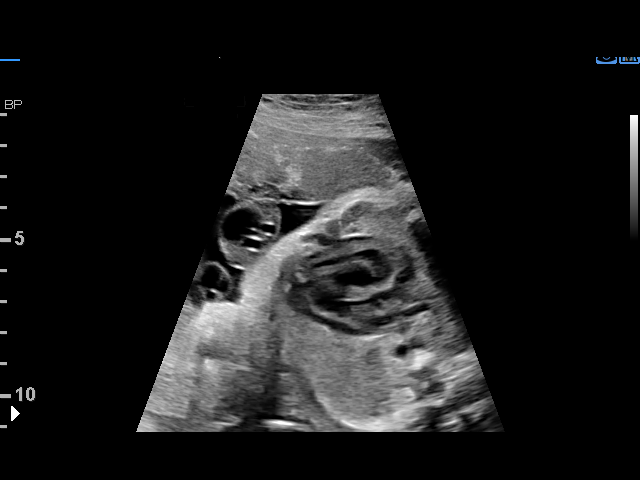
[im 36/88]
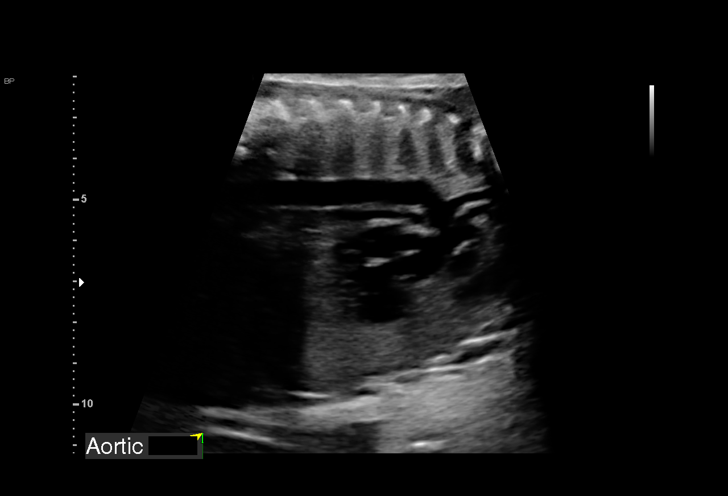
[im 46/88]
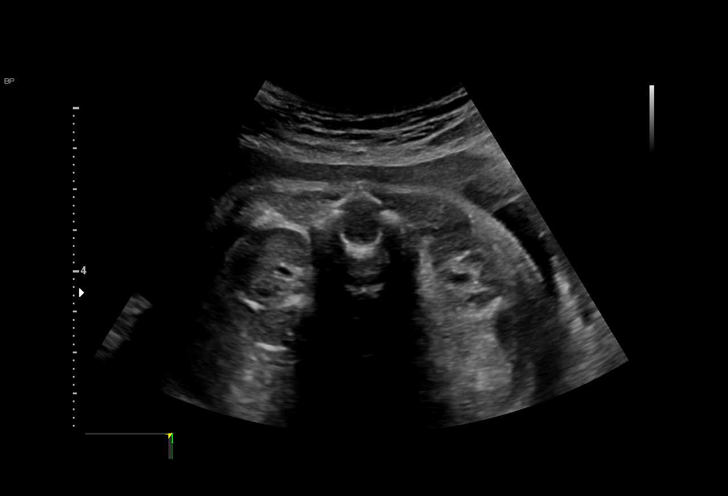
[im 52/88]
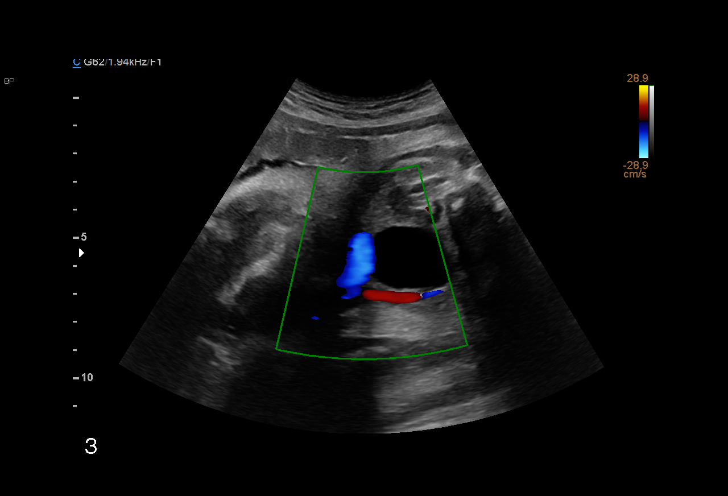
[im 59/88]
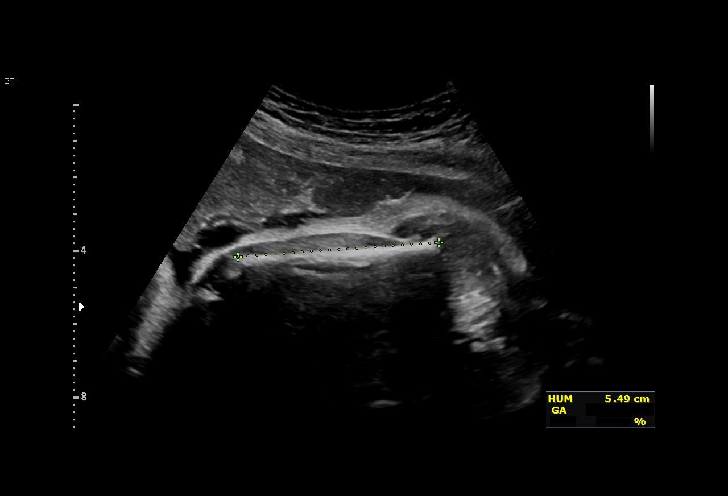
[im 65/88]
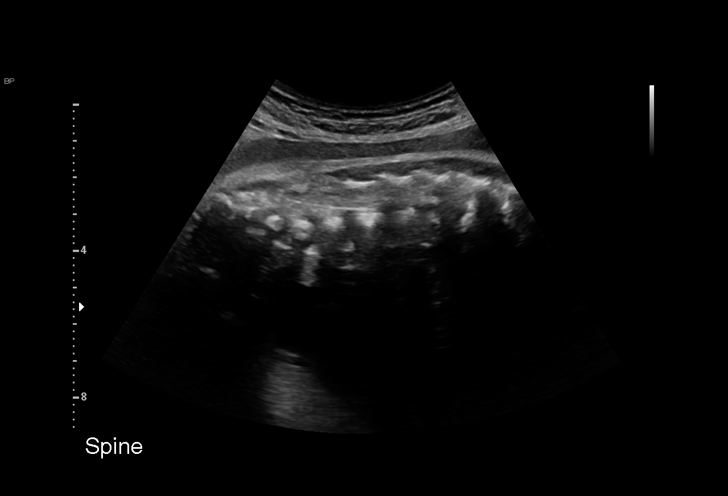
[im 71/88]
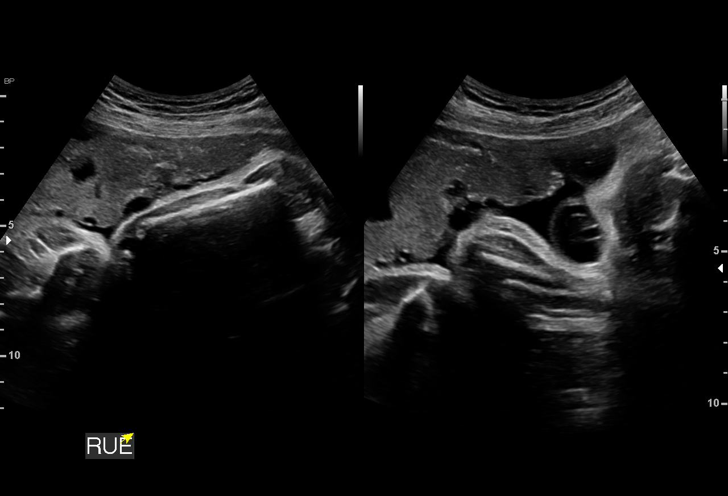
[im 78/88]
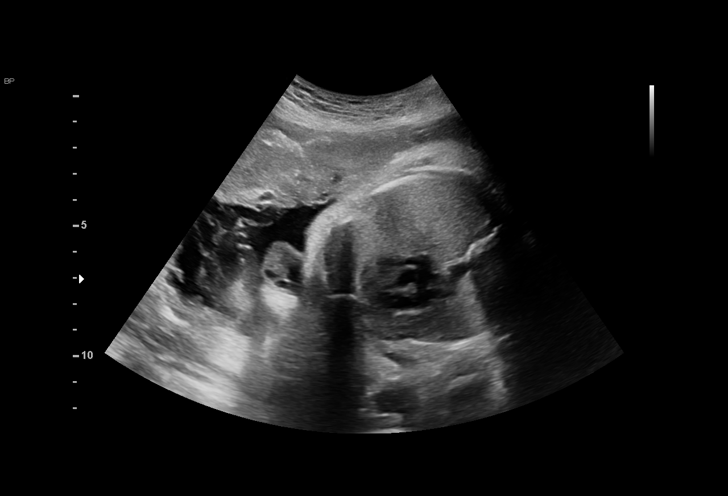
[im 84/88]
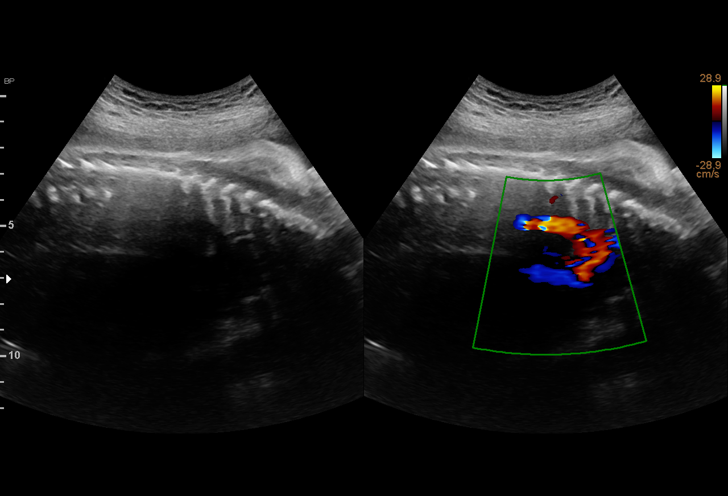

[13 of 28 positions shown; findings below may reference images not displayed]

[facility]                                 [HOSPITAL] at

 1  US MFM OB DETAIL +14 WK               [address].01    [place]
                                                      ANDRES RODRIGO

Indications

 Late prenatal care, second trimester
 Encounter for antenatal screening for
 malformations
 LR NIPS
 34 weeks gestation of pregnancy
Fetal Evaluation

 Num Of Fetuses:         1
 Fetal Heart Rate(bpm):  130
 Cardiac Activity:       Observed
 Presentation:           Cephalic
 Placenta:               Right lateral
 P. Cord Insertion:      Visualized, central

 Amniotic Fluid
 AFI FV:      Within normal limits

 AFI Sum(cm)     %Tile       Largest Pocket(cm)
 11.59           31

 RUQ(cm)       RLQ(cm)       LUQ(cm)        LLQ(cm)

Biometry
 BPD:        90  mm     G. Age:  36w 3d         95  %    CI:        82.01   %    70 - 86
                                                         FL/HC:      20.3   %    19.4 -
 HC:      313.6  mm     G. Age:  35w 1d         35  %    HC/AC:      1.06        0.96 -
 AC:      294.6  mm     G. Age:  33w 3d         30  %    FL/BPD:     70.9   %    71 - 87
 FL:       63.8  mm     G. Age:  33w 0d         12  %    FL/AC:      21.7   %    20 - 24
 HUM:      55.8  mm     G. Age:  32w 3d         22  %
 LV:        4.5  mm

 Est. FW:    7723  gm           5 lb     29  %
OB History

 Gravidity:    1
 Living:       0
Gestational Age

 LMP:           36w 5d        Date:  06/13/20                 EDD:   03/20/21
 U/S Today:     34w 4d                                        EDD:   04/04/21
 Best:          34w 2d     Det. By:  Previous Ultrasound      EDD:   04/06/21
                                     (12/16/20)
Anatomy

 Cranium:               Appears normal         LVOT:                   Appears normal
 Cavum:                 Appears normal         Aortic Arch:            Appears normal
 Ventricles:            Appears normal         Ductal Arch:            Not well visualized
 Choroid Plexus:        Appears normal         Diaphragm:              Appears normal
 Cerebellum:            Not well visualized    Stomach:                Appears normal, left
                                                                       sided
 Posterior Fossa:       Not well visualized    Abdomen:                Not well visualized
 Nuchal Fold:           Not applicable (>20    Abdominal Wall:         Not well visualized
                        wks GA)
 Face:                  Not well visualized    Cord Vessels:           Appears normal (3
                                                                       vessel cord)
 Lips:                  Appears normal         Kidneys:                Appear normal
 Palate:                Not well visualized    Bladder:                Appears normal
 Thoracic:              Appears normal         Spine:                  Limited views
                                                                       appear normal
 Heart:                 Appears normal         Upper Extremities:      Visualized
                        (4CH, axis, and
                        situs)
 RVOT:                  Appears normal         Lower Extremities:      Visualized

 Other:  Fetus appears to be female.VC, 3VV and 3VTV visualized.
         Technically difficult due to advanced GA and fetal position. Hands
         and feet not well visualized.
Cervix Uterus Adnexa

 Cervix
 Not visualized (advanced GA >36wks)

 Uterus
 Normal shape and size.
 Right Ovary
 Within normal limits.

 Left Ovary
 Not visualized.

 Cul De Sac
 No free fluid seen.

 Adnexa
 No adnexal mass visualized.
Impression

 Single intrauterine pregnancy here for a detailed anatomy
 due to late transfer of care.
 Normal anatomy with measurements consistent with dates
 There is good fetal movement and amniotic fluid volume
 Suboptimal views of the fetal anatomy were obtained
 secondary to fetal position and advanced gestational age.
Recommendations

 Follow up as clinically indicated.
